# Patient Record
Sex: Male | Born: 1960 | Race: White | Hispanic: No | Marital: Married | Smoking: Never smoker
Health system: Southern US, Community
[De-identification: ages and names within clinical notes are randomized; demographics above are authoritative.]

## PROBLEM LIST (undated history)

## (undated) DIAGNOSIS — E785 Hyperlipidemia, unspecified: Secondary | ICD-10-CM

## (undated) DIAGNOSIS — E119 Type 2 diabetes mellitus without complications: Secondary | ICD-10-CM

## (undated) DIAGNOSIS — K429 Umbilical hernia without obstruction or gangrene: Secondary | ICD-10-CM

## (undated) HISTORY — DX: Umbilical hernia without obstruction or gangrene: K42.9

## (undated) HISTORY — PX: OTHER SURGICAL HISTORY: SHX169

## (undated) HISTORY — DX: Hyperlipidemia, unspecified: E78.5

## (undated) HISTORY — DX: Type 2 diabetes mellitus without complications: E11.9

---

## 2013-05-02 ENCOUNTER — Encounter: Payer: Self-pay | Admitting: Cardiology

## 2013-05-03 ENCOUNTER — Encounter: Payer: Self-pay | Admitting: Cardiology

## 2013-05-07 ENCOUNTER — Ambulatory Visit (INDEPENDENT_AMBULATORY_CARE_PROVIDER_SITE_OTHER): Payer: BC Managed Care – PPO | Admitting: Cardiology

## 2013-05-07 ENCOUNTER — Encounter: Payer: Self-pay | Admitting: Cardiology

## 2013-05-07 VITALS — BP 115/76 | HR 87 | Ht 73.0 in | Wt 251.1 lb

## 2013-05-07 DIAGNOSIS — I2510 Atherosclerotic heart disease of native coronary artery without angina pectoris: Secondary | ICD-10-CM

## 2013-05-07 DIAGNOSIS — R9431 Abnormal electrocardiogram [ECG] [EKG]: Secondary | ICD-10-CM

## 2013-05-07 DIAGNOSIS — E785 Hyperlipidemia, unspecified: Secondary | ICD-10-CM | POA: Insufficient documentation

## 2013-05-07 NOTE — Progress Notes (Signed)
Clinical Summary Mr. Teater is a 52 y.o.male   1. Abnormal EKG - presented 05/03/13 to [facility] w/ urinary symptoms, had EKG done with reported abnormalities.Referred for further evaluation.  - denies any prior cardiac history, reports his father and paternal grandfather had MIs in there 65s  - denies any chest pain. Denies any significant DOE, can swim about 20 laps in the pool without any troubles. Denies any orthopnea, PND, or LE edema.  - no history of lightheadedness, dizziness  2.HL: followed by PCP, compliant w/ simva 20mg .     Past Medical History  Diagnosis Date  . Hyperlipidemia   . Diabetes mellitus, type 2   . Umbilical hernia      Allergies no known allergies   Current Outpatient Prescriptions  Medication Sig Dispense Refill  . LEVEMIR FLEXPEN 100 UNIT/ML SOPN Inject 100 Units into the skin 2 (two) times daily.       . metformin (FORTAMET) 1000 MG (OSM) 24 hr tablet Take 1,000 mg by mouth 2 (two) times daily with a meal.       . simvastatin (ZOCOR) 20 MG tablet Take 20 mg by mouth at bedtime.        No current facility-administered medications for this visit.     Past Surgical History  Procedure Laterality Date  . Cyst removed from testicle      30 years ago     Allergies no known allergies    Family History  Problem Relation Age of Onset  . Heart attack Father   . Heart attack      paternal grandfather; cause of death at 43  . CAD Father   . CAD      paternal grandfather  . Cancer      Unspecified GI cancer; cause of death  . Transient ischemic attack Mother     multiple  . Other Mother     chronic back problems     Social History Mr. Quebedeaux reports that he has never smoked. He does not have any smokeless tobacco history on file. Mr. Halbert reports that he does not drink alcohol.   Review of Systems 12 point ROS negative other than reported in HPI  Physical Examination p 87 bp 115/76 Gen: resting comfortably, NAD HEENT:  no scleral icterus, pupils equal round and reactive, no palptable cervical adenopathy CV: RRR, no m/r/g, no JVD, no carotid bruits Pulm: CTAB Abd: soft, NT, ND NABS, no hepatosplenomegaly Ext: warm, no edema.  Skin: warm, no rash Neuro: A&Ox3, no focal deficits    Diagnostic Studies Pertinent Labs 05/02/13: Na 133 K 3.5 Cr 0.9 AST 18 ALT 20 Trop neg TSH 1.64 INR 1.0 Hgb 14.5 Hct 42   CTA chest: no aortic dissection  05/02/13 EKG: sinus tach rate 100, LAFB, RBBB (bifascicular block), LAE, no acute ischemic changes. Lateral Qs possibly due to LAFB, but appears to have qR' pattern in V1,V2 suggestive of prior infarct   Assessment and Plan  1. Abnormal EKG - incidental finding of bifasicular block during ER visit for urinary symptoms, appears to have qR' complex in V1/V2 suggestive of prior anterior infarct as well. Asymptomatic. - will order 2D echo to eval systolic function and for wall motion abnormalities. Further workup based on those results  2. HL  per PCP. Would recommend at least moderate dose statin (atorva 40mg  daily) given his history of DM. If further evidence of CAD, guidelines would suggest high dose statin (atorva 80 or crestor 20)  Arnoldo Lenis, M.D., F.A.C.C.

## 2013-05-07 NOTE — Patient Instructions (Signed)
Your physician recommends that you schedule a follow-up appointment in: 1 month with Dr. Wyline Mood. This appointment will be scheduled today before you leave.   Your physician recommends that you continue on your current medications as directed. Please refer to the Current Medication list given to you today.  Your physician has requested that you have an echocardiogram. Echocardiography is a painless test that uses sound waves to create images of your heart. It provides your doctor with information about the size and shape of your heart and how well your heart's chambers and valves are working. This procedure takes approximately one hour. There are no restrictions for this procedure.

## 2013-05-14 ENCOUNTER — Other Ambulatory Visit: Payer: Self-pay

## 2013-05-14 ENCOUNTER — Ambulatory Visit (INDEPENDENT_AMBULATORY_CARE_PROVIDER_SITE_OTHER): Payer: BC Managed Care – PPO | Admitting: Cardiology

## 2013-05-14 DIAGNOSIS — I2510 Atherosclerotic heart disease of native coronary artery without angina pectoris: Secondary | ICD-10-CM

## 2013-05-20 ENCOUNTER — Telehealth: Payer: Self-pay | Admitting: Cardiology

## 2013-05-20 NOTE — Telephone Encounter (Signed)
Pt informed of echo results.

## 2013-05-20 NOTE — Telephone Encounter (Signed)
Message copied by Burnice Logan on Tue May 20, 2013 10:02 AM ------      Message from: Dina Rich F      Created: Fri May 16, 2013  9:56 AM       Please let patient know that echo results are  normal ------

## 2013-05-20 NOTE — Telephone Encounter (Signed)
Message copied by Burnice Logan on Tue May 20, 2013 10:44 AM ------      Message from: Dina Rich F      Created: Fri May 16, 2013  9:56 AM       Please let patient know that echo results are  normal ------

## 2013-05-20 NOTE — Telephone Encounter (Signed)
LM with pt wife for pt to call us back.

## 2013-05-20 NOTE — Patient Instructions (Addendum)
Opened in error

## 2013-05-20 NOTE — Progress Notes (Signed)
Open in error

## 2013-06-03 ENCOUNTER — Encounter: Payer: Self-pay | Admitting: Cardiology

## 2013-06-03 ENCOUNTER — Ambulatory Visit (INDEPENDENT_AMBULATORY_CARE_PROVIDER_SITE_OTHER): Payer: BC Managed Care – PPO | Admitting: Cardiology

## 2013-06-03 VITALS — BP 124/75 | HR 86 | Ht 73.0 in | Wt 250.0 lb

## 2013-06-03 DIAGNOSIS — I452 Bifascicular block: Secondary | ICD-10-CM

## 2013-06-03 DIAGNOSIS — R9431 Abnormal electrocardiogram [ECG] [EKG]: Secondary | ICD-10-CM

## 2013-06-03 NOTE — Progress Notes (Signed)
Clinical Summary Mr. Dollins is a 52 y.o.male  1. Abnormal EKG  - presented 05/03/13 to [facility] ER w/ urinary symptoms, had EKG done with reported abnormalities.Referred for further evaluation.  - denies any prior cardiac history, reports his father and paternal grandfather had MIs in there 59s  - denies any chest pain. Denies any significant DOE, can swim about 20 laps in the pool without any troubles. Denies any orthopnea, PND, or LE edema.  - no history of lightheadedness, dizziness   - no change since last visit   2.HL: followed by PCP, compliant w/ simva 20mg .      Past Medical History  Diagnosis Date  . Hyperlipidemia   . Diabetes mellitus, type 2   . Umbilical hernia      No Known Allergies   Current Outpatient Prescriptions  Medication Sig Dispense Refill  . aspirin 81 MG tablet Take 81 mg by mouth daily.      Marland Kitchen LEVEMIR FLEXPEN 100 UNIT/ML SOPN Inject 90 Units into the skin 2 (two) times daily.       . metformin (FORTAMET) 1000 MG (OSM) 24 hr tablet Take 1,000 mg by mouth 2 (two) times daily with a meal.       . nitroGLYCERIN (NITROSTAT) 0.4 MG SL tablet Place 0.4 mg under the tongue every 5 (five) minutes as needed for chest pain.      . simvastatin (ZOCOR) 20 MG tablet Take 20 mg by mouth at bedtime.        No current facility-administered medications for this visit.     Past Surgical History  Procedure Laterality Date  . Cyst removed from testicle      30 years ago     No Known Allergies    Family History  Problem Relation Age of Onset  . Heart attack Father   . Heart attack      paternal grandfather; cause of death at 2  . CAD Father   . CAD      paternal grandfather  . Cancer      Unspecified GI cancer; cause of death  . Transient ischemic attack Mother     multiple  . Other Mother     chronic back problems     Social History Mr. Tolen reports that he has never smoked. He does not have any smokeless tobacco history on  file. Mr. Maston reports that he does not drink alcohol.   Review of Systems CONSTITUTIONAL: No weight loss, fever, chills, weakness or fatigue.  HEENT: Eyes: No visual loss, blurred vision, double vision or yellow sclerae.No hearing loss, sneezing, congestion, runny nose or sore throat.  SKIN: No rash or itching.  CARDIOVASCULAR: per HPI RESPIRATORY: No shortness of breath, cough or sputum.  GASTROINTESTINAL: No anorexia, nausea, vomiting or diarrhea. No abdominal pain or blood.  GENITOURINARY: No burning on urination, no polyuria NEUROLOGICAL: No headache, dizziness, syncope, paralysis, ataxia, numbness or tingling in the extremities. No change in bowel or bladder control.  MUSCULOSKELETAL: No muscle, back pain, joint pain or stiffness.  LYMPHATICS: No enlarged nodes. No history of splenectomy.  PSYCHIATRIC: No history of depression or anxiety.  ENDOCRINOLOGIC: No reports of sweating, cold or heat intolerance. No polyuria or polydipsia.  Marland Kitchen   Physical Examination p 86 bp 124/75 WT 250 lbs BMI 33 Gen: resting comfortably, no acute distress HEENT: no scleral icterus, pupils equal round and reactive, no palptable cervical adenopathy,  CV: RRR, no m/r/g, no JVD, no carotid bruits Resp:  Clear to auscultation bilaterally GI: abdomen is soft, non-tender, non-distended, normal bowel sounds, no hepatosplenomegaly MSK: extremities are warm, no edema.  Skin: warm, no rash Neuro:  no focal deficits Psych: appropriate affect   Diagnostic Studies  Pertinent Labs 05/02/13: Na 133 K 3.5 Cr 0.9 AST 18 ALT 20 Trop neg TSH 1.64 INR 1.0 Hgb 14.5 Hct 42  CTA chest: no aortic dissection   05/02/13 EKG: sinus tach rate 100, LAFB, RBBB (bifascicular block), LAE, no acute ischemic changes. Lateral Qs possibly due to LAFB, but appears to have qR' pattern in V1,V2 suggestive of prior infarct  05/15/13 Echo: LVEF 60-65%, no WMA, grade I diastolic dysfunction,   Assessment and Plan  1. Abnormal EKG  -  incidental finding of bifasicular block during ER visit for urinary symptoms, appears to have qR' complex in V1/V2 suggestive of prior anterior infarct as well. Asymptomatic.  - echo overall normal, normal LVEF with no wall motion abnormalities - no further workup at this time. His conduction disease may progress over time but he currently is not having any symptoms, will continue to follow.    2. HL  per PCP. Would recommend at least moderate dose statin (atorva 40mg  daily) given his history of DM. If further evidence of CAD, guidelines would suggest high dose statin (atorva 80 or crestor 20)       Antoine Poche, M.D., F.A.C.C.

## 2013-06-03 NOTE — Patient Instructions (Signed)
Your physician recommends that you schedule a follow-up appointment in: 1 year with Dr. Branch. You should receive a letter in the mail in 10 months. If you do not receive this letter by August 2015 call our office to schedule this appointment.   Your physician recommends that you continue on your current medications as directed. Please refer to the Current Medication list given to you today.   

## 2013-10-28 ENCOUNTER — Ambulatory Visit: Payer: BC Managed Care – PPO | Admitting: Endocrinology

## 2013-10-28 DIAGNOSIS — Z0289 Encounter for other administrative examinations: Secondary | ICD-10-CM

## 2014-10-07 ENCOUNTER — Telehealth: Payer: Self-pay | Admitting: Cardiology

## 2014-10-07 NOTE — Telephone Encounter (Signed)
Contacted patient today by telephone trying to schedule a follow up appointment. Left a message.  Patient has been notified by recall letters but no response.  User: Time: StatusMegan Salon:   Vicky T Slaughter [[id number]][[id number]] 06/03/2013 10:05 AM New [10]   [System] 02/18/2014 11:00 PM Notification Sent Berenda Morale[20]   Megan SalonVicky T Slaughter [[id number]][[id number]] 07/14/2014 9:42 AM Notification Sent [20]   Geraldine ContrasStephanie R Smith [[id number]][[id number]] 09/02/2014 3:28 PM Notification Sent [20]

## 2015-02-01 ENCOUNTER — Encounter (INDEPENDENT_AMBULATORY_CARE_PROVIDER_SITE_OTHER): Payer: BLUE CROSS/BLUE SHIELD | Admitting: Ophthalmology

## 2015-02-01 DIAGNOSIS — H43813 Vitreous degeneration, bilateral: Secondary | ICD-10-CM

## 2015-02-01 DIAGNOSIS — E11319 Type 2 diabetes mellitus with unspecified diabetic retinopathy without macular edema: Secondary | ICD-10-CM

## 2015-02-01 DIAGNOSIS — E11329 Type 2 diabetes mellitus with mild nonproliferative diabetic retinopathy without macular edema: Secondary | ICD-10-CM | POA: Diagnosis not present

## 2015-02-01 DIAGNOSIS — H4900 Third [oculomotor] nerve palsy, unspecified eye: Secondary | ICD-10-CM

## 2015-02-03 ENCOUNTER — Other Ambulatory Visit: Payer: Self-pay | Admitting: Ophthalmology

## 2015-02-03 ENCOUNTER — Ambulatory Visit
Admission: RE | Admit: 2015-02-03 | Discharge: 2015-02-03 | Disposition: A | Discharge status: Home / Self Care | Payer: BLUE CROSS/BLUE SHIELD | Source: Ambulatory Visit | Attending: Ophthalmology | Admitting: Ophthalmology

## 2015-02-03 DIAGNOSIS — H4901 Third [oculomotor] nerve palsy, right eye: Secondary | ICD-10-CM

## 2015-02-03 DIAGNOSIS — H02401 Unspecified ptosis of right eyelid: Secondary | ICD-10-CM | POA: Diagnosis present

## 2015-02-03 DIAGNOSIS — E119 Type 2 diabetes mellitus without complications: Secondary | ICD-10-CM | POA: Insufficient documentation

## 2015-02-03 DIAGNOSIS — H532 Diplopia: Secondary | ICD-10-CM | POA: Insufficient documentation

## 2015-02-03 DIAGNOSIS — J320 Chronic maxillary sinusitis: Secondary | ICD-10-CM | POA: Diagnosis not present

## 2015-02-03 MED ORDER — IOHEXOL 350 MG/ML SOLN
80.0000 mL | Freq: Once | INTRAVENOUS | Status: AC | PRN
  Administered 2015-02-03: 80 mL via INTRAVENOUS

## 2015-12-22 IMAGING — CT CT ANGIO HEAD
3 of 9 series · 16 of 47 positions shown · IV contrast (APPLIED)
Comparison: None.

ADDENDUM:
Study discussed by telephone with Dr. LINDEN GOEL on 02/03/2015 at
0545 hours.
CLINICAL DATA: 54-year-old male with right eye blurred vision.
Right oculomotor nerve palsy. Initial encounter.

EXAM:
CT ANGIOGRAPHY HEAD
TECHNIQUE: Multidetector CT imaging of the head was performed using the
standard protocol during bolus administration of intravenous
contrast. Multiplanar CT image reconstructions and MIPs were
obtained to evaluate the vascular anatomy.
CONTRAST:  80mL OMNIPAQUE IOHEXOL 350 MG/ML SOLN

[Series 7: cta head · axial · 0.44mm/px · z∈[-134,-10]mm · 10 of 151 slices shown]
[im 14/151  brain]
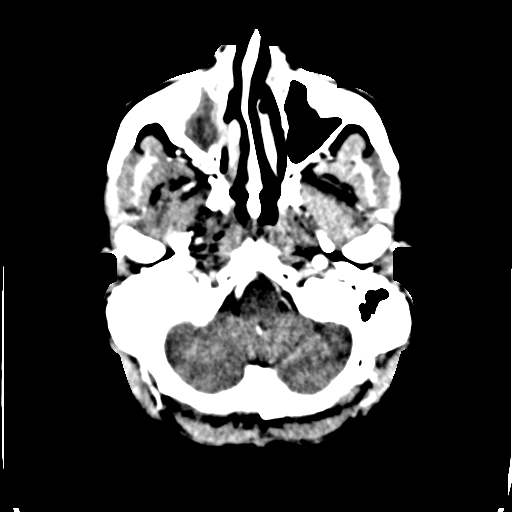
[im 28/151  bone]
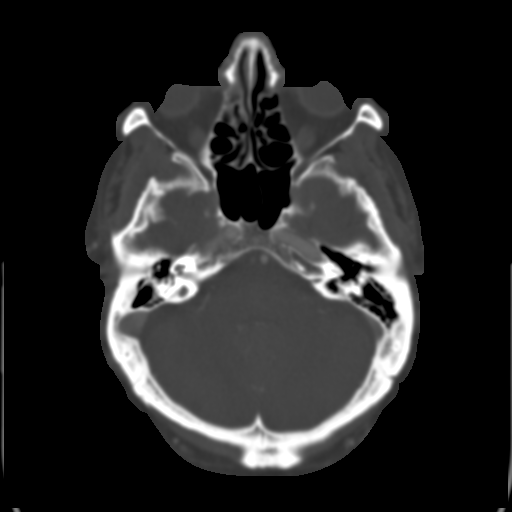
[im 41/151  brain]
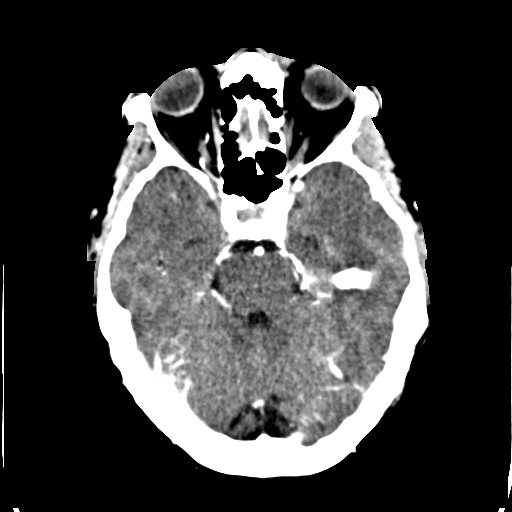
[im 55/151  bone]
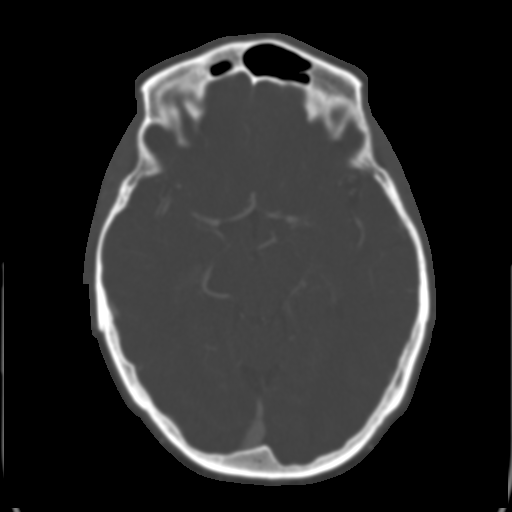
[im 69/151  brain]
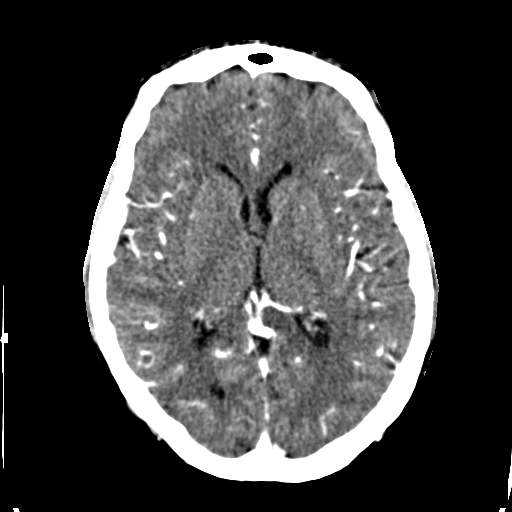
[im 82/151  bone]
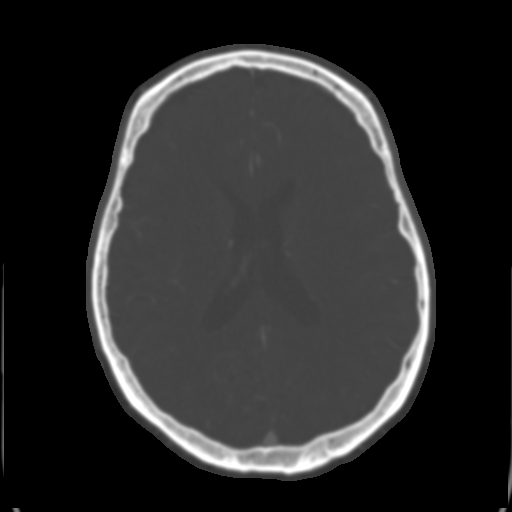
[im 96/151  brain]
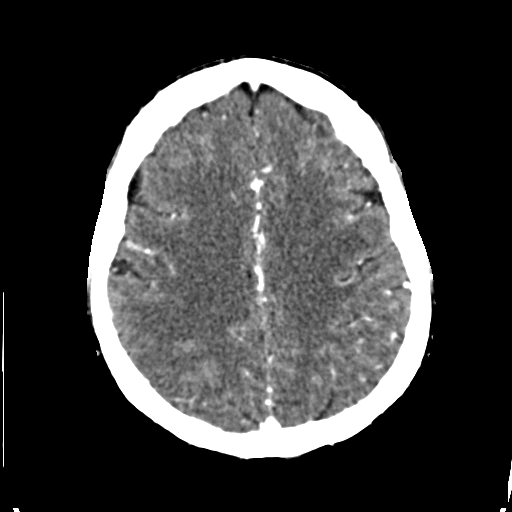
[im 110/151  bone]
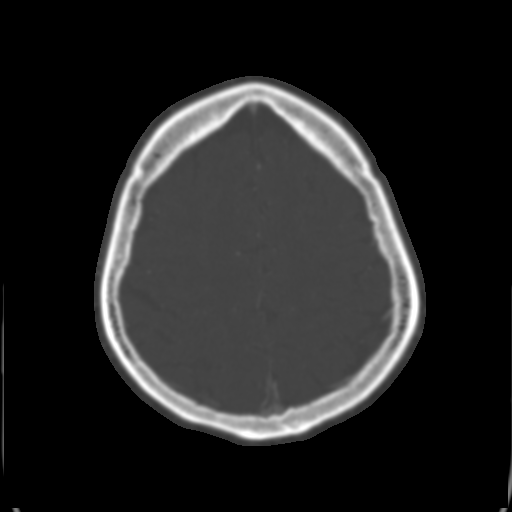
[im 123/151  brain]
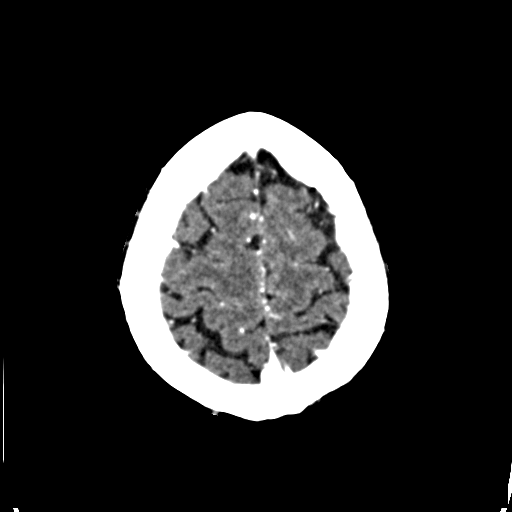
[im 137/151  bone]
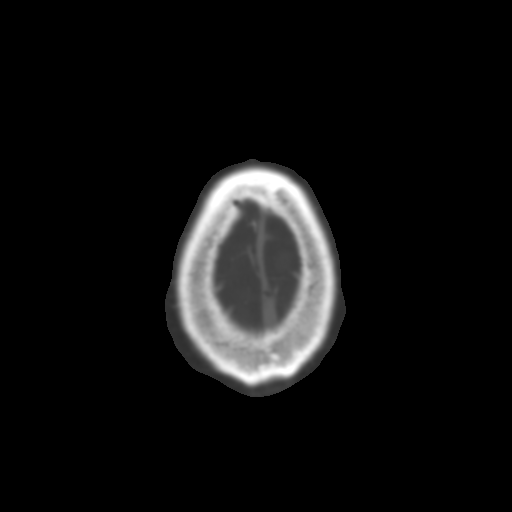

[Series 10: cor thin · coronal · 0.30mm/px · 3 of 204 slices shown]
[im 59/204  brain]
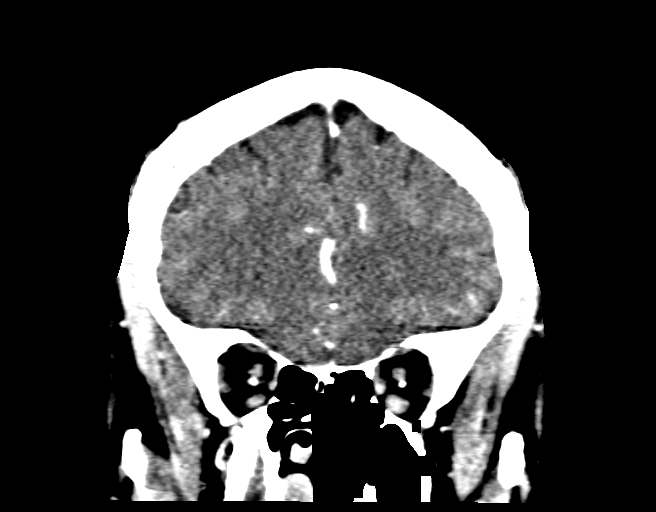
[im 88/204  brain]
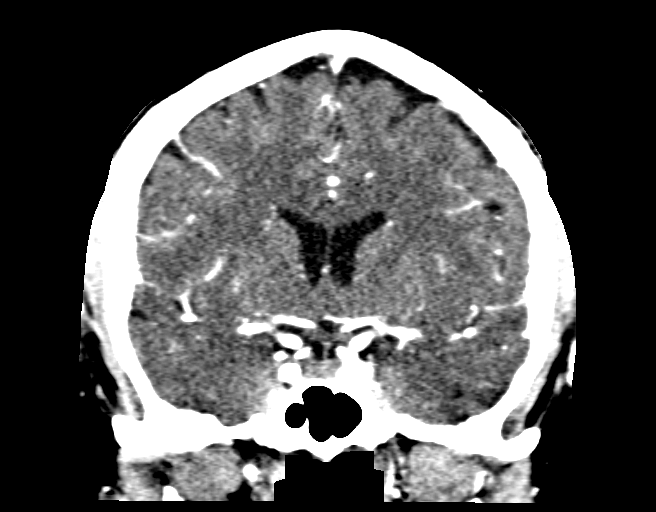
[im 117/204  brain]
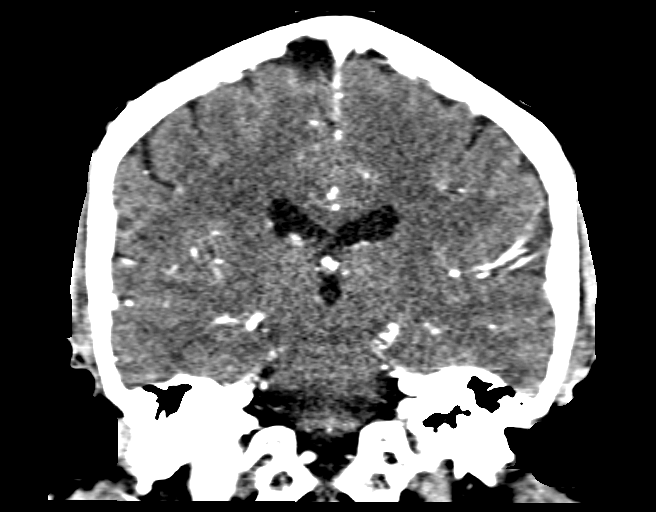

[Series 12: sag thin · sagittal · 0.31mm/px · 3 of 157 slices shown]
[im 32/157  brain]
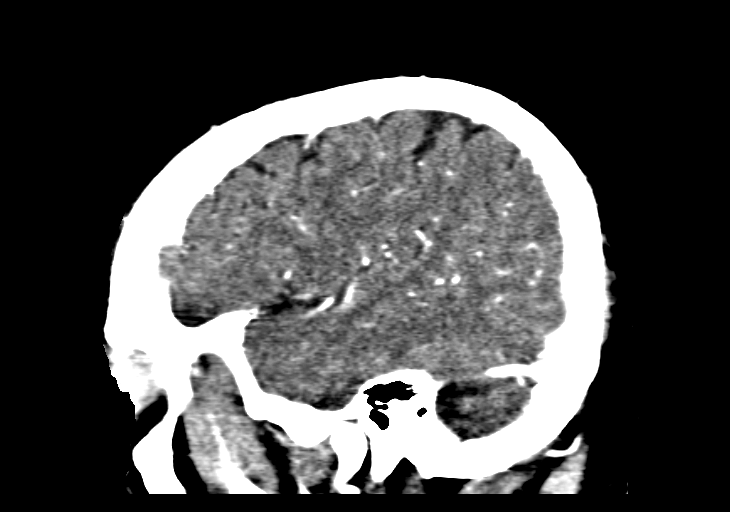
[im 63/157  brain]
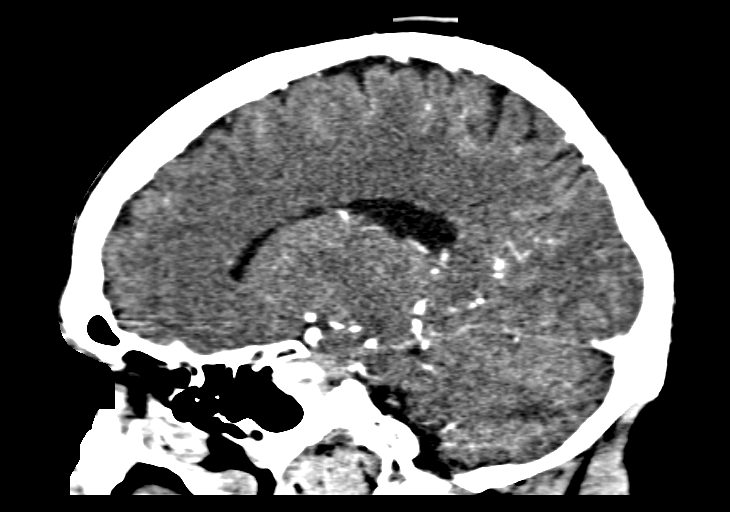
[im 94/157  brain]
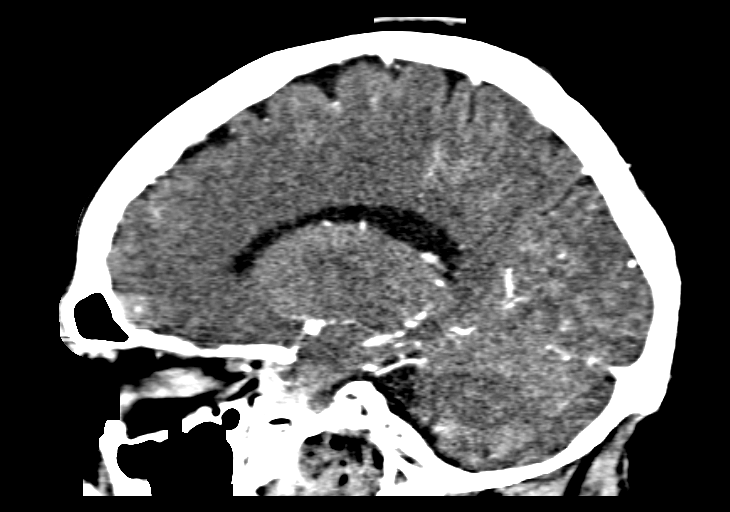

[16 of 47 positions shown; findings below may reference images not displayed]

FINDINGS: CT HEAD

Brain: Cerebral volume is within normal limits. No ventriculomegaly.
No midline shift, mass effect, or evidence of intracranial mass
lesion. No evidence of cortically based acute infarction identified.
No acute intracranial hemorrhage identified. Gray-white matter
differentiation is within normal limits throughout the brain.

Calvarium and skull base: No osseous abnormality identified.

Paranasal sinuses: Opacified right maxillary sinus with associated
periosteal thickening (series 5, image 3). Other Visualized
paranasal sinuses and mastoids are clear.

Orbits: Bilateral orbits soft tissues appear normal.

Visualized scalp soft tissues are within normal limits.

CTA HEAD

Posterior circulation: Dominant appearing distal right vertebral
artery. Normal PICA origins. The left vertebral artery functionally
terminates in PICA. Normal vertebrobasilar junction. Normal basilar
artery. Normal AICA origins. Normal SCA origins. Normal left PCA
origin. Fetal type right PCA origin. Both posterior communicating
arteries are present, the right is larger. Bilateral PCA branches
are within normal limits.

Anterior circulation: Minimal calcified plaque in the visible distal
cervical right ICA. Both ICA siphons are patent with no siphon
atherosclerosis or stenosis. Bilateral ophthalmic and posterior
communicating artery origins are normal (the right PComm origin is
on series 12, image 66). Bilateral carotid termini are patent.
Bilateral MCA and ACA origins are normal.

Anterior communicating artery is diminutive or absent. Bilateral PCA
branches are within normal limits. Left MCA branches are within
normal limits. Right MCA branches are within normal limits.

Venous sinuses: Within normal limits.

Anatomic variants: Dominant distal right vertebral artery, fetal
type right PCA origin.

Delayed phase:No abnormal enhancement identified. Negative CT
appearance of the cavernous sinuses.
IMPRESSION: 1. Negative intracranial CTA. No intracranial aneurysm or stenosis.
There is a fetal type origin of the right PCA (normal variant), but
the right posterior communicating artery appears normal.
2. Negative CT appearance of the brain.
3. Right maxillary sinusitis, chronic versus acute on chronic.
